# Patient Record
Sex: Male | Born: 1935 | Race: White | Hispanic: No | Smoking: Never smoker
Health system: Southern US, Community
[De-identification: ages and names within clinical notes are randomized; demographics above are authoritative.]

## PROBLEM LIST (undated history)

## (undated) DIAGNOSIS — E119 Type 2 diabetes mellitus without complications: Secondary | ICD-10-CM

## (undated) DIAGNOSIS — I10 Essential (primary) hypertension: Secondary | ICD-10-CM

---

## 2017-07-29 ENCOUNTER — Other Ambulatory Visit: Payer: Self-pay

## 2017-07-29 ENCOUNTER — Emergency Department: Payer: Medicare Other

## 2017-07-29 ENCOUNTER — Emergency Department
Admission: EM | Admit: 2017-07-29 | Discharge: 2017-07-29 | Disposition: A | Discharge status: Home / Self Care | Payer: Medicare Other | Attending: Emergency Medicine | Admitting: Emergency Medicine

## 2017-07-29 DIAGNOSIS — S60519A Abrasion of unspecified hand, initial encounter: Secondary | ICD-10-CM

## 2017-07-29 DIAGNOSIS — Y999 Unspecified external cause status: Secondary | ICD-10-CM | POA: Diagnosis not present

## 2017-07-29 DIAGNOSIS — I10 Essential (primary) hypertension: Secondary | ICD-10-CM | POA: Diagnosis not present

## 2017-07-29 DIAGNOSIS — E119 Type 2 diabetes mellitus without complications: Secondary | ICD-10-CM | POA: Insufficient documentation

## 2017-07-29 DIAGNOSIS — Z7984 Long term (current) use of oral hypoglycemic drugs: Secondary | ICD-10-CM | POA: Insufficient documentation

## 2017-07-29 DIAGNOSIS — Y9289 Other specified places as the place of occurrence of the external cause: Secondary | ICD-10-CM | POA: Diagnosis not present

## 2017-07-29 DIAGNOSIS — S60512A Abrasion of left hand, initial encounter: Secondary | ICD-10-CM | POA: Insufficient documentation

## 2017-07-29 DIAGNOSIS — S8991XA Unspecified injury of right lower leg, initial encounter: Secondary | ICD-10-CM | POA: Diagnosis present

## 2017-07-29 DIAGNOSIS — W101XXA Fall (on)(from) sidewalk curb, initial encounter: Secondary | ICD-10-CM | POA: Diagnosis not present

## 2017-07-29 DIAGNOSIS — S60419A Abrasion of unspecified finger, initial encounter: Secondary | ICD-10-CM

## 2017-07-29 DIAGNOSIS — S8001XA Contusion of right knee, initial encounter: Secondary | ICD-10-CM | POA: Diagnosis not present

## 2017-07-29 DIAGNOSIS — Y9301 Activity, walking, marching and hiking: Secondary | ICD-10-CM | POA: Insufficient documentation

## 2017-07-29 DIAGNOSIS — S40011A Contusion of right shoulder, initial encounter: Secondary | ICD-10-CM

## 2017-07-29 HISTORY — DX: Essential (primary) hypertension: I10

## 2017-07-29 HISTORY — DX: Type 2 diabetes mellitus without complications: E11.9

## 2017-07-29 NOTE — Discharge Instructions (Signed)
Follow-up with your doctor at the [facility] if any continued problems or the walk-in clinic at [facility].  Clean hand daily with mild soap and water and watch for any signs of infection.  You may continue your pain medication that was prescribed for you by the [facility].

## 2017-07-29 NOTE — ED Notes (Signed)
Pt  Hand placed in basin with NS to soak at this time.

## 2017-07-29 NOTE — ED Provider Notes (Signed)
[facility] Emergency Department Provider Note  ____________________________________________   First MD Initiated Contact with Patient 07/29/17 1142     (approximate)  I have reviewed the triage vital signs and the nursing notes.   HISTORY  Chief Complaint Fall   HPI Manuel Waller is a 84 y.o. male is here with complaint of right knee and right shoulder pain.  Patient stepped up on a curb going into [facility].  Patient states that he tripped on the curb.  He also has scrapes on the fingers of his left hand and the palm of his right hand.  Patient states that he had a tetanus shot a couple months ago.  Patient has all his medical care at the [facility] in [place].  Currently rates his pain as 10/10.   Past Medical History:  Diagnosis Date  . Diabetes mellitus without complication (HCC)   . Hypertension     There are no active problems to display for this patient.   History reviewed. No pertinent surgical history.  Prior to Admission medications   Medication Sig Start Date End Date Taking? Authorizing Provider  glipiZIDE (GLUCOTROL) 10 MG tablet Take 10 mg by mouth daily before breakfast.   Yes [provider]  metFORMIN (GLUCOPHAGE) 500 MG tablet Take 500 mg by mouth 2 (two) times daily with a meal.   Yes [provider]    Allergies Patient has no known allergies.  No family history on file.  Social History Social History   Tobacco Use  . Smoking status: Never Smoker  . Smokeless tobacco: Never Used  Substance Use Topics  . Alcohol use: Never    Frequency: Never  . Drug use: Never    Review of Systems Constitutional: No fever/chills Eyes: No visual changes. ENT: No trauma. Cardiovascular: Denies chest pain. Respiratory: Denies shortness of breath. Gastrointestinal: No abdominal pain.  No nausea, no vomiting.   Musculoskeletal: Positive for right knee pain, right shoulder pain. Skin: Positive for  abrasions. Neurological: Negative for headaches, focal weakness or numbness. ___________________________________________   PHYSICAL EXAM:  VITAL SIGNS: ED Triage Vitals  Enc Vitals Group     BP 07/29/17 1110 (!) 177/78     Pulse Rate 07/29/17 1110 60     Resp 07/29/17 1110 18     Temp 07/29/17 1110 97.8 F (36.6 C)     Temp Source 07/29/17 1110 Oral     SpO2 07/29/17 1110 97 %     Weight 07/29/17 1110 211 lb (95.7 kg)     Height 07/29/17 1110 6' (1.829 m)     Head Circumference --      Peak Flow --      Pain Score 07/29/17 1115 10     Pain Loc --      Pain Edu? --      Excl. in GC? --     Constitutional: Alert and oriented. Well appearing and in no acute distress. Eyes: Conjunctivae are normal. PERRL. EOMI. Head: Atraumatic. Nose: No trauma. Neck: No stridor.   Cardiovascular: Normal rate, regular rhythm. Grossly normal heart sounds.  Good peripheral circulation. Respiratory: Normal respiratory effort.  No retractions. Lungs CTAB. Gastrointestinal: Soft and nontender. No distention.  Musculoskeletal: Examination of the right shoulder there is no gross deformity and range of motion does not have crepitus.  Patient is moderately tender to palpation anteriorly but no ecchymosis or soft tissue swelling noted.  No gross deformity present.  Skin is intact.  Patient is able  to move forearm and wrist distally without any difficulties.  On examination of the right knee there is superficial abrasions noted anteriorly on the patella.  There is no soft tissue swelling or effusion present.  Patient is able to flex and extend without any difficulties and was also able to bear weight.  On palpation of the cervical, thoracic and lumbar spine there was no tenderness to palpation.  Left shoulder and left lower extremity without injury. Neurologic:  Normal speech and language. No gross focal neurologic deficits are appreciated.  Skin:  Skin is warm, dry.  Superficial abrasions noted to the right  knee.  There is also superficial abrasions noted to the distal aspects of several fingers on his left hand without active bleeding at this time. Psychiatric: Mood and affect are normal. Speech and behavior are normal.  ____________________________________________   LABS (all labs ordered are listed, but only abnormal results are displayed)  Labs Reviewed - No data to display ____________________________________________  EKG  Right knee. ____________________________________________  RADIOLOGY  ED MD interpretation:   Right shoulder is negative for fracture.  Right knee is negative for fracture.  Official radiology report(s): Dg Shoulder Right  Result Date: 07/29/2017 CLINICAL DATA:  Right shoulder injury today over a curb when and fell the patient tripped. Initial encounter. EXAM: RIGHT SHOULDER - 2+ VIEW COMPARISON:  None. FINDINGS: There is no evidence of fracture or dislocation. Mild acromioclavicular osteoarthritis is noted. Soft tissues are unremarkable. IMPRESSION: No acute abnormality. Electronically Signed   By: Drusilla Kanner M.D.   On: 07/29/2017 12:24   Dg Knee Complete 4 Views Right  Result Date: 07/29/2017 CLINICAL DATA:  The patient tripped over a curb today resulting in a fall and right knee injury. Initial encounter. EXAM: RIGHT KNEE - COMPLETE 4+ VIEW COMPARISON:  None. FINDINGS: No evidence of fracture, dislocation, or joint effusion. No evidence of arthropathy or other focal bone abnormality. Soft tissues are unremarkable. IMPRESSION: Negative exam. Electronically Signed   By: Drusilla Kanner M.D.   On: 07/29/2017 12:24    ____________________________________________   PROCEDURES  Procedure(s) performed: None  Procedures  Critical Care performed: No  ____________________________________________   INITIAL IMPRESSION / ASSESSMENT AND PLAN / ED COURSE  As part of my medical decision making, I reviewed the following data within the electronic medical  record:  Notes from prior ED visits and [place] Controlled Substance Database  Patient is alert and oriented and very talkative.  Patient was made aware that he did not have any fractures.  Abrasions were cleaned and patient was instructed to clean daily with mild soap and water and watch for any signs of infection.  Patient is up-to-date on tetanus through the [facility].  He states he has pain medication at home which he will take.  He is encouraged to use ice to his knee as needed for swelling or discomfort.  He will follow-up with the [facility].  ____________________________________________   FINAL CLINICAL IMPRESSION(S) / ED DIAGNOSES  Final diagnoses:  Contusion of right knee, initial encounter  Contusion of right shoulder, initial encounter  Abrasion of multiple sites of hand and finger, initial encounter     ED Discharge Orders    None       Note:  This document was prepared using Dragon voice recognition software and may include unintentional dictation errors.    Tommi Rumps, PA-C 07/29/17 1541    Emily Filbert, MD 07/30/17 [id number]

## 2017-07-29 NOTE — ED Notes (Signed)
See triage note. States missed a step  Larey SeatFell   Having pain to right shoulder and knee   Abrasions noted to both hands  States he is not able to bear wt

## 2017-07-29 NOTE — ED Notes (Signed)
Bandages placed on finger tips left hand, right palm, right knee

## 2017-07-29 NOTE — ED Triage Notes (Signed)
Pt was stepping up on the curb at [facility] and tripped causing him to fall no right knee and right shoulder - he also scrapped finger on left hand and palm of right hand

## 2019-05-28 IMAGING — CR DG SHOULDER 2+V*R*
3 series · 3 of 3 positions shown · non-contrast
Comparison: None.

CLINICAL DATA: Right shoulder injury today over a curb when and
fell the patient tripped. Initial encounter.

EXAM:
RIGHT SHOULDER - 2+ VIEW

[shoulder grashey]
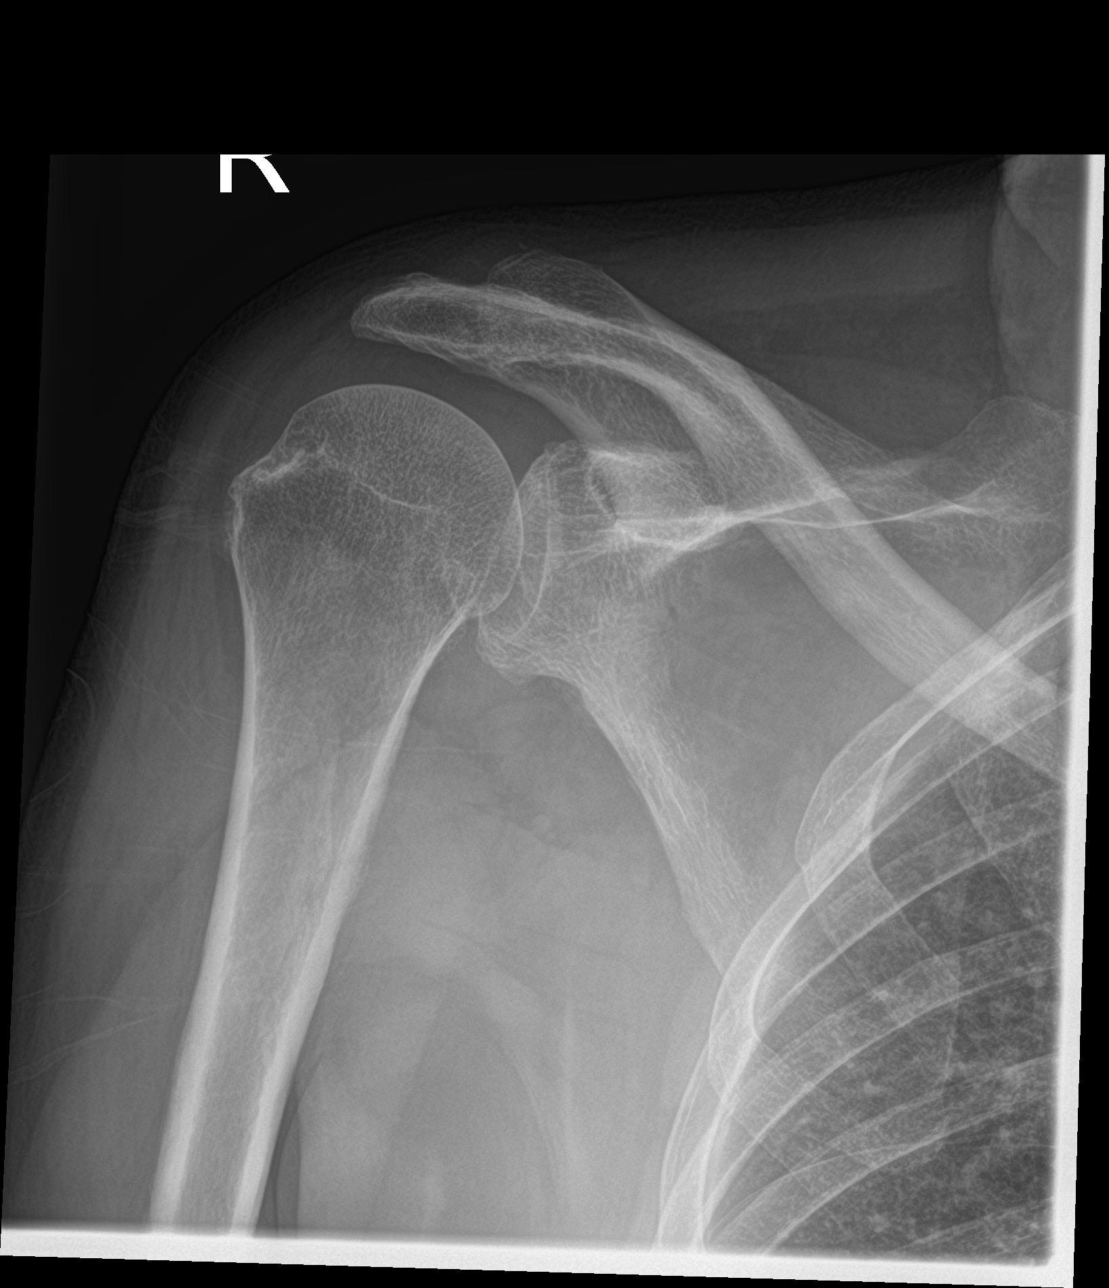

[shoulder y view]
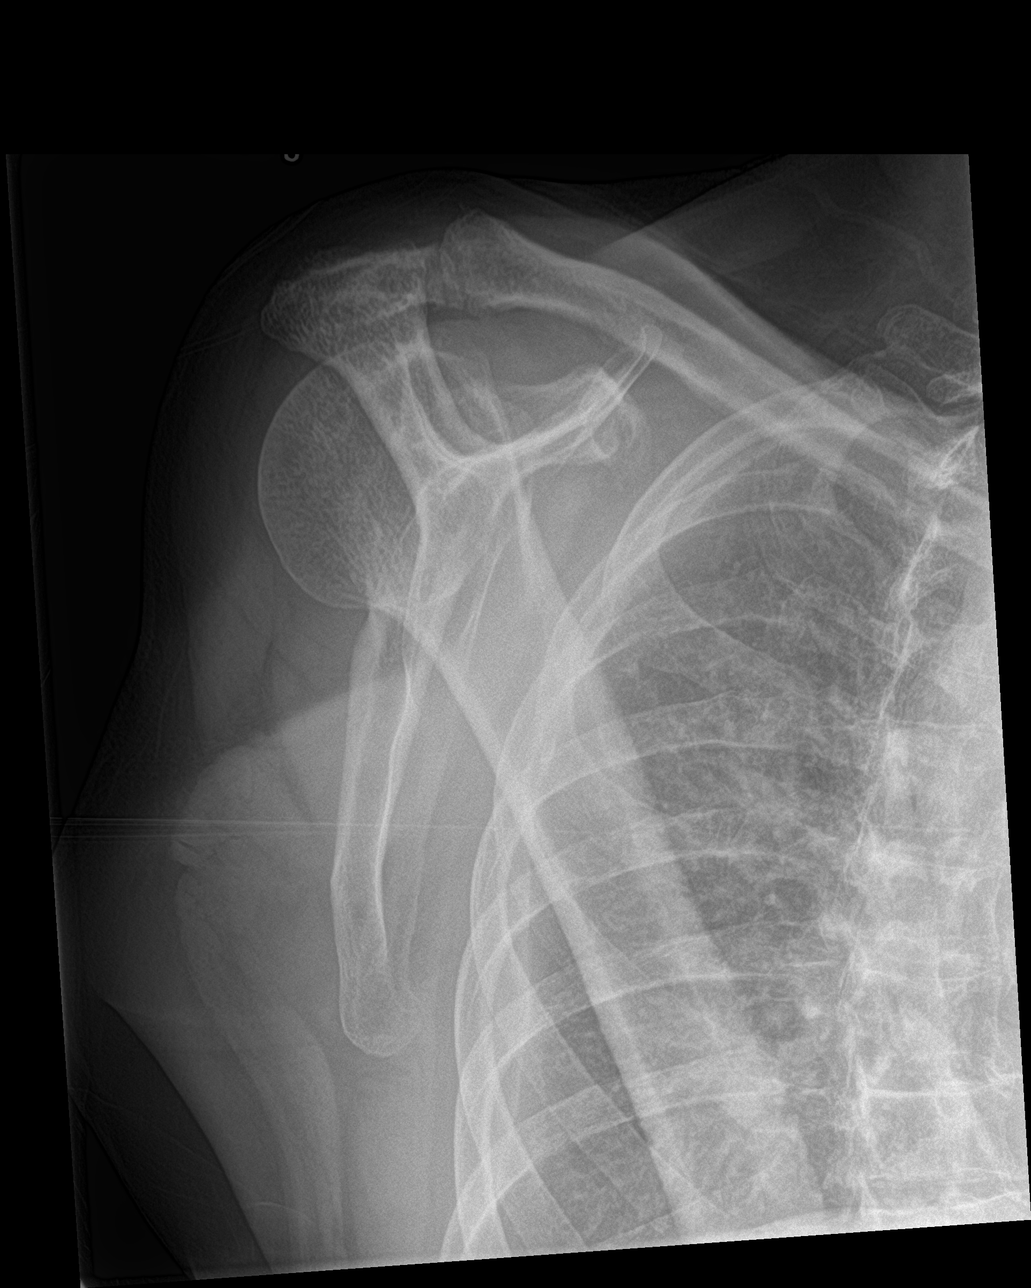

[shoulder axillary]
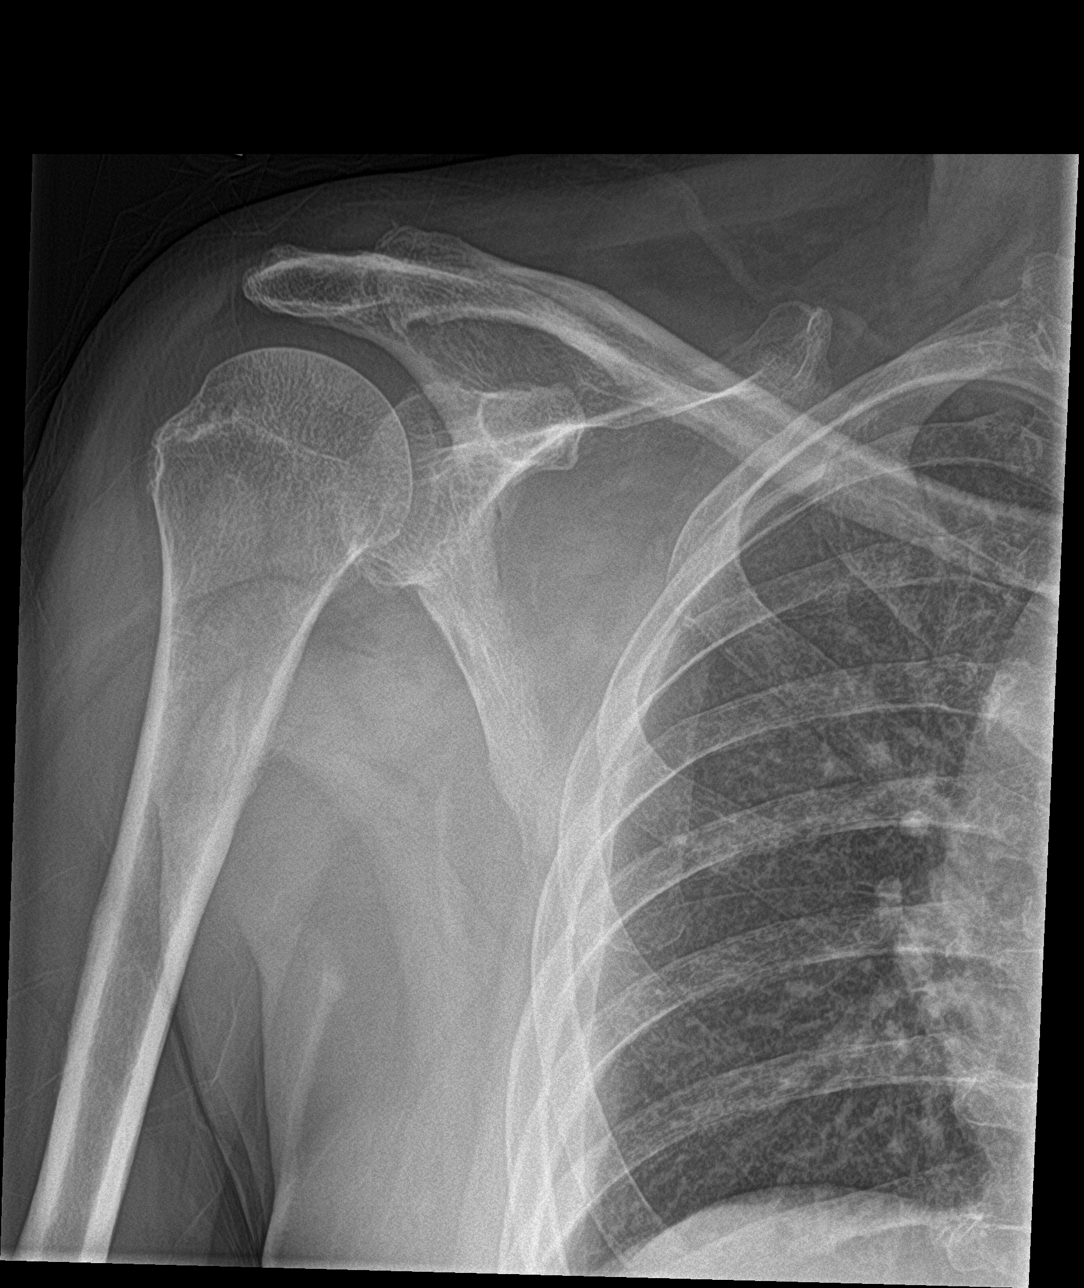

[3 of 3 positions shown; findings below may reference images not displayed]

FINDINGS: There is no evidence of fracture or dislocation. Mild
acromioclavicular osteoarthritis is noted. Soft tissues are
unremarkable.
IMPRESSION: No acute abnormality.

## 2019-06-30 ENCOUNTER — Encounter: Payer: Self-pay | Admitting: Urology

## 2019-06-30 ENCOUNTER — Other Ambulatory Visit: Payer: Self-pay

## 2019-06-30 ENCOUNTER — Ambulatory Visit (INDEPENDENT_AMBULATORY_CARE_PROVIDER_SITE_OTHER): Payer: No Typology Code available for payment source | Admitting: Urology

## 2019-06-30 VITALS — BP 171/72 | HR 77 | Ht 72.0 in | Wt 225.0 lb

## 2019-06-30 DIAGNOSIS — R310 Gross hematuria: Secondary | ICD-10-CM | POA: Diagnosis not present

## 2019-06-30 NOTE — Progress Notes (Signed)
   06/15/19 9:22 AM   [name] 08/15/1931 [id number]  Referring provider: [facility] [address] [place],  [place] [address] Chief Complaint  Patient presents with  . Hematuria    HPI: Manuel Waller is a 86 y.o. male referred from the [place] for cystoscopy after an episode of gross hematuria.  -Gross hematuria episode 12/2018 -Noted stringy blood clot with urination and then blood in the underwear the next day -1 additional episode of blood in underwear 2 months ago -Seen at the [place] in [place] and a CT scan was ordered  -Outside referral for cystoscopy due to backlog in the [place] system -Has baseline urinary urgency with occasional urge incotinence  PMH: Past Medical History:  Diagnosis Date  . Diabetes mellitus without complication (HCC)   . Hypertension     Surgical History: No past surgical history on file.  Home Medications:  Allergies as of 06/30/2019   No Known Allergies     Medication List       Accurate as of June 30, 2019  9:22 AM. If you have any questions, ask your nurse or doctor.        Bayer Aspirin EC Low Dose 81 MG EC tablet Generic drug: aspirin   cetirizine 10 MG tablet Commonly known as: ZYRTEC   glipiZIDE 10 MG tablet Commonly known as: GLUCOTROL Take 10 mg by mouth daily before breakfast.   lisinopril 40 MG tablet Commonly known as: ZESTRIL   meclizine 25 MG tablet Commonly known as: ANTIVERT   metFORMIN 500 MG tablet Commonly known as: GLUCOPHAGE Take 500 mg by mouth 2 (two) times daily with a meal.   omeprazole 20 MG capsule Commonly known as: PRILOSEC   pioglitazone 30 MG tablet Commonly known as: ACTOS       Allergies: No Known Allergies  Family History: No family history on file.  Social History:  reports that he has never smoked. He has never used smokeless tobacco. He reports that he does not drink alcohol and does not use drugs.   Physical Exam: BP (!) 171/72   Pulse 77   Ht 6'  (1.829 m)   Wt 225 lb (102.1 kg)   BMI 30.52 kg/m   Constitutional:  Alert and oriented, No acute distress. HEENT: [place] AT, moist mucus membranes.  Trachea midline, no masses. Cardiovascular: No clubbing, cyanosis, or edema. Respiratory: Normal respiratory effort, no increased work of breathing. Skin: No rashes, bruises or suspicious lesions. Neurologic: Grossly intact, no focal deficits, moving all 4 extremities. Psychiatric: Normal mood and affect.   Assessment & Plan:    1. Gross Hematuria with Intermittent Urethral Bleeding -Will schedule cystoscopy -Upper tract imaging has been ordered through the [facility] [address] [place], [place] [address] [phone]  I, Francina Ames Peace, am acting as a Neurosurgeon for Dr. Lorin Picket C. Caitlen Worth.  I have reviewed the above documentation for accuracy and completeness, and I agree with the above.   Riki Altes, MD

## 2019-06-30 NOTE — Addendum Note (Signed)
Addended by: Levada Schilling on: 06/30/2019 01:16 PM   Modules accepted: Orders

## 2019-07-05 LAB — URINALYSIS, COMPLETE
Bilirubin, UA: NEGATIVE
Glucose, UA: NEGATIVE
Ketones, UA: NEGATIVE
Leukocytes,UA: NEGATIVE
Nitrite, UA: NEGATIVE
Protein,UA: NEGATIVE
RBC, UA: NEGATIVE
Specific Gravity, UA: 1.02 (ref 1.005–1.030)
Urobilinogen, Ur: 0.2 mg/dL (ref 0.2–1.0)
pH, UA: 5 (ref 5.0–7.5)

## 2019-07-05 LAB — MICROSCOPIC EXAMINATION
Bacteria, UA: NONE SEEN
RBC: NONE SEEN /hpf (ref 0–2)

## 2019-07-29 NOTE — Progress Notes (Signed)
   07/30/2019  CC:  Chief Complaint  Patient presents with  . Cysto    WSF:KCLEXNT Manuel Waller is a 86 y.o. male with gross hematuria and intermittent urethral bleeding returns today for a cysto.   -Gross hematuria episode 12/2018 -Noted stringy blood clot with urination and then blood in the underwear the next day -1 additional episode of blood in underwear 2 months ago -Seen at the [place] in [place] and a CT scan was ordered  -Outside referral for cystoscopy due to backlog in the [place] system -Has baseline urinary urgency with occasional urge incotinence -He had upper tract imaging done at the [place]. -Denies recurrent hematuria   Blood pressure (!) 181/75, pulse 62, height 6' (1.829 m), weight 225 lb (102.1 kg). NED. A&Ox3.   No respiratory distress   Abd soft, NT, ND Normal phallus with bilateral descended testicles  Cystoscopy Procedure Note  Patient identification was confirmed, informed consent was obtained, and patient was prepped using Betadine solution.  Lidocaine jelly was administered per urethral meatus.     Pre-Procedure: - Inspection reveals a normal caliber urethral meatus.  Procedure: The flexible cystoscope was introduced without difficulty - No urethral strictures/lesions are present. - Marked lateral lobe enlargement prostate with hypervascularity/friability - Mild elevation bladder neck - Bilateral ureteral orifices identified - Bladder mucosa  reveals no ulcers, tumors, or lesions - No bladder stones -Mild trabeculation   Retroflexion showed small-moderate intravesical median lobe.     Post-Procedure: - Patient tolerated the procedure well  Assessment/ Plan: 1. Gross hematuria BPH which is the most likely etiology of his gross hematuria No bladder mucosal abnormalities noted Urine cytology sent We will request [facility] [place] [facility]  I, Theador Hawthorne, am acting as a scribe for Dr. Lorin Picket C. Dajuan Turnley,  I have  reviewed the above documentation for accuracy and completeness, and I agree with the above.   Riki Altes, MD

## 2019-07-30 ENCOUNTER — Other Ambulatory Visit: Payer: Self-pay

## 2019-07-30 ENCOUNTER — Other Ambulatory Visit: Payer: Self-pay | Admitting: Urology

## 2019-07-30 ENCOUNTER — Encounter: Payer: Self-pay | Admitting: Urology

## 2019-07-30 ENCOUNTER — Ambulatory Visit (INDEPENDENT_AMBULATORY_CARE_PROVIDER_SITE_OTHER): Payer: No Typology Code available for payment source | Admitting: Urology

## 2019-07-30 VITALS — BP 181/75 | HR 62 | Ht 72.0 in | Wt 225.0 lb

## 2019-07-30 DIAGNOSIS — R310 Gross hematuria: Secondary | ICD-10-CM | POA: Diagnosis not present

## 2019-08-02 LAB — URINALYSIS, COMPLETE
Bilirubin, UA: NEGATIVE
Glucose, UA: NEGATIVE
Ketones, UA: NEGATIVE
Leukocytes,UA: NEGATIVE
Nitrite, UA: NEGATIVE
Protein,UA: NEGATIVE
RBC, UA: NEGATIVE
Specific Gravity, UA: 1.01 (ref 1.005–1.030)
Urobilinogen, Ur: 0.2 mg/dL (ref 0.2–1.0)
pH, UA: 5 (ref 5.0–7.5)

## 2019-08-02 LAB — MICROSCOPIC EXAMINATION
Bacteria, UA: NONE SEEN
RBC: NONE SEEN /hpf (ref 0–2)

## 2019-08-02 LAB — CYTOLOGY - NON PAP

## 2019-08-03 ENCOUNTER — Telehealth: Payer: Self-pay | Admitting: *Deleted

## 2019-08-03 LAB — PATHOLOGY

## 2019-08-03 NOTE — Telephone Encounter (Signed)
Notified patient as instructed, patient pleased. Discussed follow-up appointments, patient agrees  

## 2019-08-03 NOTE — Telephone Encounter (Signed)
-----   Message from Riki Altes, MD sent at 08/03/2019  7:44 AM EDT ----- Urine cytology showed no abnormal cells

## 2019-08-05 ENCOUNTER — Telehealth: Payer: Self-pay | Admitting: *Deleted

## 2019-08-05 NOTE — Telephone Encounter (Signed)
-----   Message from Riki Altes, MD sent at 08/04/2019  9:27 PM EDT ----- Urine cytology negative for abnormal cells

## 2019-08-05 NOTE — Telephone Encounter (Signed)
Notified patient as instructed, patient pleased. Discussed follow-up appointments, patient agrees  

## 2023-01-22 DIAGNOSIS — Z7984 Long term (current) use of oral hypoglycemic drugs: Secondary | ICD-10-CM | POA: Diagnosis not present

## 2023-01-22 DIAGNOSIS — S59902A Unspecified injury of left elbow, initial encounter: Secondary | ICD-10-CM | POA: Diagnosis not present

## 2023-01-22 DIAGNOSIS — S51812A Laceration without foreign body of left forearm, initial encounter: Secondary | ICD-10-CM | POA: Diagnosis not present

## 2023-01-22 DIAGNOSIS — Z79899 Other long term (current) drug therapy: Secondary | ICD-10-CM | POA: Diagnosis not present
